# Patient Record
Sex: Male | Born: 1970 | Race: Black or African American | Hispanic: No | Marital: Married | Smoking: Former smoker
Health system: Southern US, Community
[De-identification: ages and names within clinical notes are randomized; demographics above are authoritative.]

## PROBLEM LIST (undated history)

## (undated) DIAGNOSIS — I10 Essential (primary) hypertension: Secondary | ICD-10-CM

## (undated) DIAGNOSIS — Z969 Presence of functional implant, unspecified: Secondary | ICD-10-CM

## (undated) HISTORY — PX: WRIST SURGERY: SHX841

## (undated) HISTORY — PX: APPENDECTOMY: SHX54

---

## 2015-02-18 ENCOUNTER — Encounter (HOSPITAL_COMMUNITY): Payer: Self-pay | Admitting: Emergency Medicine

## 2015-02-18 ENCOUNTER — Emergency Department (HOSPITAL_COMMUNITY): Payer: Self-pay

## 2015-02-18 ENCOUNTER — Emergency Department (HOSPITAL_COMMUNITY)
Admission: EM | Admit: 2015-02-18 | Discharge: 2015-02-18 | Disposition: A | Discharge status: Home / Self Care | Payer: Self-pay | Attending: Emergency Medicine | Admitting: Emergency Medicine

## 2015-02-18 DIAGNOSIS — Z8781 Personal history of (healed) traumatic fracture: Secondary | ICD-10-CM | POA: Insufficient documentation

## 2015-02-18 DIAGNOSIS — M25531 Pain in right wrist: Secondary | ICD-10-CM | POA: Insufficient documentation

## 2015-02-18 DIAGNOSIS — G8929 Other chronic pain: Secondary | ICD-10-CM | POA: Insufficient documentation

## 2015-02-18 DIAGNOSIS — R531 Weakness: Secondary | ICD-10-CM | POA: Insufficient documentation

## 2015-02-18 DIAGNOSIS — I10 Essential (primary) hypertension: Secondary | ICD-10-CM | POA: Insufficient documentation

## 2015-02-18 DIAGNOSIS — Z9889 Other specified postprocedural states: Secondary | ICD-10-CM | POA: Insufficient documentation

## 2015-02-18 HISTORY — DX: Essential (primary) hypertension: I10

## 2015-02-18 MED ORDER — HYDROCODONE-ACETAMINOPHEN 5-325 MG PO TABS
1.0000 | ORAL_TABLET | Freq: Once | ORAL | Status: AC
  Administered 2015-02-18: 1 via ORAL
  Filled 2015-02-18: qty 1

## 2015-02-18 MED ORDER — HYDROCODONE-ACETAMINOPHEN 5-325 MG PO TABS: 1.0000 | ORAL_TABLET | ORAL | Status: AC | PRN
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
## 2015-02-18 NOTE — Discharge Instructions (Signed)
Wrist Pain Wrist injuries are frequent in adults and children. A sprain is an injury to the ligaments that hold your bones together. A strain is an injury to muscle or muscle cord-like structures (tendons) from stretching or pulling. Generally, when wrists are moderately tender to touch following a fall or injury, a break in the bone (fracture) may be present. Most wrist sprains or strains are better in 3 to 5 days, but complete healing may take several weeks. HOME CARE INSTRUCTIONS   Put ice on the injured area.  Put ice in a plastic bag.  Place a towel between your skin and the bag.  Leave the ice on for 15-20 minutes, 3-4 times a day, for the first 2 days, or as directed by your health care provider.  Keep your arm raised above the level of your heart whenever possible to reduce swelling and pain.  Rest the injured area for at least 48 hours or as directed by your health care provider.  If a splint or elastic bandage has been applied, use it for as long as directed by your health care provider or until seen by a health care provider for a follow-up exam.  Only take over-the-counter or prescription medicines for pain, discomfort, or fever as directed by your health care provider.  Keep all follow-up appointments. You may need to follow up with a specialist or have follow-up X-rays. Improvement in pain level is not a guarantee that you did not fracture a bone in your wrist. The only way to determine whether or not you have a broken bone is by X-ray. SEEK IMMEDIATE MEDICAL CARE IF:   Your fingers are swollen, very red, white, or cold and blue.  Your fingers are numb or tingling.  You have increasing pain.  You have difficulty moving your fingers. MAKE SURE YOU:   Understand these instructions.  Will watch your condition.  Will get help right away if you are not doing well or get worse. Document Released: 03/09/2005 Document Revised: 06/04/2013 Document Reviewed:  07/21/2010 [facility] Patient Information 2015 [place], [place]. This information is not intended to replace advice given to you by your health care provider. Make sure you discuss any questions you have with your health care provider.   You may take the hydrocodone prescribed for pain relief.  This will make you drowsy - do not drive within 4 hours of taking this medication.

## 2015-02-18 NOTE — ED Notes (Signed)
Patient reports has had surgery to right wrist and has broken right hand about a year ago. Reports is having increased pain to right wrist and right hand. Denies recent injury.

## 2015-03-10 ENCOUNTER — Other Ambulatory Visit: Payer: Self-pay | Admitting: Orthopedic Surgery

## 2015-03-11 ENCOUNTER — Encounter (HOSPITAL_BASED_OUTPATIENT_CLINIC_OR_DEPARTMENT_OTHER): Payer: Self-pay | Admitting: *Deleted

## 2015-03-16 ENCOUNTER — Encounter (HOSPITAL_BASED_OUTPATIENT_CLINIC_OR_DEPARTMENT_OTHER): Payer: Self-pay | Admitting: *Deleted

## 2015-03-16 ENCOUNTER — Ambulatory Visit (HOSPITAL_BASED_OUTPATIENT_CLINIC_OR_DEPARTMENT_OTHER): Payer: Self-pay | Admitting: Anesthesiology

## 2015-03-16 ENCOUNTER — Encounter (HOSPITAL_BASED_OUTPATIENT_CLINIC_OR_DEPARTMENT_OTHER)
Admission: RE | Disposition: A | Discharge status: Home / Self Care | Payer: Self-pay | Source: Ambulatory Visit | Attending: Orthopedic Surgery

## 2015-03-16 ENCOUNTER — Ambulatory Visit (HOSPITAL_BASED_OUTPATIENT_CLINIC_OR_DEPARTMENT_OTHER)
Admission: RE | Admit: 2015-03-16 | Discharge: 2015-03-16 | Disposition: A | Discharge status: Home / Self Care | Payer: Self-pay | Source: Ambulatory Visit | Attending: Orthopedic Surgery | Admitting: Orthopedic Surgery

## 2015-03-16 DIAGNOSIS — Z87891 Personal history of nicotine dependence: Secondary | ICD-10-CM | POA: Insufficient documentation

## 2015-03-16 DIAGNOSIS — M659 Synovitis and tenosynovitis, unspecified: Secondary | ICD-10-CM | POA: Insufficient documentation

## 2015-03-16 DIAGNOSIS — T8484XA Pain due to internal orthopedic prosthetic devices, implants and grafts, initial encounter: Secondary | ICD-10-CM | POA: Insufficient documentation

## 2015-03-16 DIAGNOSIS — Y831 Surgical operation with implant of artificial internal device as the cause of abnormal reaction of the patient, or of later complication, without mention of misadventure at the time of the procedure: Secondary | ICD-10-CM | POA: Insufficient documentation

## 2015-03-16 DIAGNOSIS — I10 Essential (primary) hypertension: Secondary | ICD-10-CM | POA: Insufficient documentation

## 2015-03-16 HISTORY — DX: Presence of functional implant, unspecified: Z96.9

## 2015-03-16 HISTORY — PX: HARDWARE REMOVAL: SHX979

## 2015-03-16 HISTORY — PX: TENOLYSIS: SHX396

## 2015-03-16 SURGERY — REMOVAL, HARDWARE
Anesthesia: General | Site: Wrist | Laterality: Right

## 2015-03-16 MED ORDER — OXYCODONE HCL 5 MG PO TABS
ORAL_TABLET | ORAL | Status: AC
  Filled 2015-03-16: qty 1

## 2015-03-16 MED ORDER — MIDAZOLAM HCL 2 MG/2ML IJ SOLN
INTRAMUSCULAR | Status: AC
  Filled 2015-03-16: qty 4

## 2015-03-16 MED ORDER — HYDROMORPHONE HCL 1 MG/ML IJ SOLN
INTRAMUSCULAR | Status: AC
  Filled 2015-03-16: qty 1

## 2015-03-16 MED ORDER — HYDROMORPHONE HCL 1 MG/ML IJ SOLN
0.2500 mg | INTRAMUSCULAR | Status: DC | PRN
  Administered 2015-03-16 (×3): 0.5 mg via INTRAVENOUS

## 2015-03-16 MED ORDER — DEXAMETHASONE SODIUM PHOSPHATE 10 MG/ML IJ SOLN
INTRAMUSCULAR | Status: AC
  Filled 2015-03-16: qty 1

## 2015-03-16 MED ORDER — LIDOCAINE HCL (CARDIAC) 20 MG/ML IV SOLN
INTRAVENOUS | Status: DC | PRN
  Administered 2015-03-16: 100 mg via INTRAVENOUS

## 2015-03-16 MED ORDER — CHLORHEXIDINE GLUCONATE 4 % EX LIQD: 60.0000 mL | Freq: Once | CUTANEOUS | Status: DC

## 2015-03-16 MED ORDER — OXYCODONE-ACETAMINOPHEN 5-325 MG PO TABS: 1.0000 | ORAL_TABLET | ORAL | Status: AC | PRN

## 2015-03-16 MED ORDER — GLYCOPYRROLATE 0.2 MG/ML IJ SOLN
INTRAMUSCULAR | Status: AC
  Filled 2015-03-16: qty 1

## 2015-03-16 MED ORDER — OXYCODONE HCL 5 MG PO TABS
5.0000 mg | ORAL_TABLET | Freq: Once | ORAL | Status: AC
Start: 2015-03-16 — End: 2015-03-16
  Administered 2015-03-16: 5 mg via ORAL

## 2015-03-16 MED ORDER — SUCCINYLCHOLINE CHLORIDE 20 MG/ML IJ SOLN
INTRAMUSCULAR | Status: AC
  Filled 2015-03-16: qty 1

## 2015-03-16 MED ORDER — BUPIVACAINE HCL (PF) 0.25 % IJ SOLN
INTRAMUSCULAR | Status: DC | PRN
Start: 2015-03-16 — End: 2015-03-16
  Administered 2015-03-16: 10 mL

## 2015-03-16 MED ORDER — MIDAZOLAM HCL 2 MG/2ML IJ SOLN
1.0000 mg | INTRAMUSCULAR | Status: DC | PRN
  Administered 2015-03-16: 2 mg via INTRAVENOUS

## 2015-03-16 MED ORDER — LIDOCAINE HCL (CARDIAC) 20 MG/ML IV SOLN
INTRAVENOUS | Status: AC
  Filled 2015-03-16: qty 5

## 2015-03-16 MED ORDER — PROMETHAZINE HCL 25 MG/ML IJ SOLN: 6.2500 mg | INTRAMUSCULAR | Status: DC | PRN

## 2015-03-16 MED ORDER — FENTANYL CITRATE (PF) 100 MCG/2ML IJ SOLN
INTRAMUSCULAR | Status: AC
  Filled 2015-03-16: qty 4

## 2015-03-16 MED ORDER — PROPOFOL 10 MG/ML IV BOLUS
INTRAVENOUS | Status: AC
  Filled 2015-03-16: qty 20

## 2015-03-16 MED ORDER — LACTATED RINGERS IV SOLN
INTRAVENOUS | Status: DC
  Administered 2015-03-16: 11:00:00 via INTRAVENOUS

## 2015-03-16 MED ORDER — ONDANSETRON HCL 4 MG/2ML IJ SOLN
INTRAMUSCULAR | Status: DC | PRN
  Administered 2015-03-16: 4 mg via INTRAVENOUS

## 2015-03-16 MED ORDER — ONDANSETRON HCL 4 MG/2ML IJ SOLN
INTRAMUSCULAR | Status: AC
Start: 2015-03-16 — End: 2015-03-16
  Filled 2015-03-16: qty 2

## 2015-03-16 MED ORDER — BUPIVACAINE HCL (PF) 0.25 % IJ SOLN
INTRAMUSCULAR | Status: AC
  Filled 2015-03-16: qty 30

## 2015-03-16 MED ORDER — CEFAZOLIN SODIUM-DEXTROSE 2-3 GM-% IV SOLR
INTRAVENOUS | Status: AC
  Filled 2015-03-16: qty 50

## 2015-03-16 MED ORDER — MEPERIDINE HCL 25 MG/ML IJ SOLN: 6.2500 mg | INTRAMUSCULAR | Status: DC | PRN

## 2015-03-16 MED ORDER — PROPOFOL 10 MG/ML IV BOLUS
INTRAVENOUS | Status: DC | PRN
  Administered 2015-03-16: 200 mg via INTRAVENOUS

## 2015-03-16 MED ORDER — CEFAZOLIN SODIUM-DEXTROSE 2-3 GM-% IV SOLR
2.0000 g | INTRAVENOUS | Status: AC
  Administered 2015-03-16: 2 g via INTRAVENOUS

## 2015-03-16 MED ORDER — GLYCOPYRROLATE 0.2 MG/ML IJ SOLN
0.2000 mg | Freq: Once | INTRAMUSCULAR | Status: AC | PRN
  Administered 2015-03-16: 0.2 mg via INTRAVENOUS

## 2015-03-16 MED ORDER — FENTANYL CITRATE (PF) 100 MCG/2ML IJ SOLN
50.0000 ug | INTRAMUSCULAR | Status: AC | PRN
  Administered 2015-03-16: 100 ug via INTRAVENOUS
  Administered 2015-03-16 (×2): 50 ug via INTRAVENOUS

## 2015-03-16 MED ORDER — DEXAMETHASONE SODIUM PHOSPHATE 4 MG/ML IJ SOLN
INTRAMUSCULAR | Status: DC | PRN
  Administered 2015-03-16: 10 mg via INTRAVENOUS

## 2015-03-16 MED ORDER — LACTATED RINGERS IV SOLN: INTRAVENOUS | Status: DC

## 2015-03-16 SURGICAL SUPPLY — 71 items
APL SKNCLS STERI-STRIP NONHPOA (GAUZE/BANDAGES/DRESSINGS) ×1
BANDAGE ELASTIC 3 VELCRO ST LF (GAUZE/BANDAGES/DRESSINGS) ×3 IMPLANT
BANDAGE ELASTIC 4 VELCRO ST LF (GAUZE/BANDAGES/DRESSINGS) ×3 IMPLANT
BENZOIN TINCTURE PRP APPL 2/3 (GAUZE/BANDAGES/DRESSINGS) ×2 IMPLANT
BLADE MINI RND TIP GREEN BEAV (BLADE) IMPLANT
BLADE SURG 15 STRL LF DISP TIS (BLADE) ×1 IMPLANT
BLADE SURG 15 STRL SS (BLADE) ×3
BNDG CMPR 9X4 STRL LF SNTH (GAUZE/BANDAGES/DRESSINGS) ×1
BNDG CMPR MD 5X2 ELC HKLP STRL (GAUZE/BANDAGES/DRESSINGS)
BNDG ELASTIC 2 VLCR STRL LF (GAUZE/BANDAGES/DRESSINGS) IMPLANT
BNDG ESMARK 4X9 LF (GAUZE/BANDAGES/DRESSINGS) ×2 IMPLANT
BNDG GAUZE ELAST 4 BULKY (GAUZE/BANDAGES/DRESSINGS) ×3 IMPLANT
BRUSH SCRUB EZ PLAIN DRY (MISCELLANEOUS) IMPLANT
CLOSURE WOUND 1/2 X4 (GAUZE/BANDAGES/DRESSINGS) ×1
CORDS BIPOLAR (ELECTRODE) ×3 IMPLANT
COVER BACK TABLE 60X90IN (DRAPES) ×3 IMPLANT
CUFF TOURNIQUET SINGLE 18IN (TOURNIQUET CUFF) ×2 IMPLANT
DECANTER SPIKE VIAL GLASS SM (MISCELLANEOUS) ×2 IMPLANT
DRAPE EXTREMITY T 121X128X90 (DRAPE) ×3 IMPLANT
DRAPE OEC MINIVIEW 54X84 (DRAPES) IMPLANT
DRAPE SURG 17X23 STRL (DRAPES) ×3 IMPLANT
DURAPREP 26ML APPLICATOR (WOUND CARE) ×3 IMPLANT
GAUZE SPONGE 4X4 12PLY STRL (GAUZE/BANDAGES/DRESSINGS) ×3 IMPLANT
GAUZE SPONGE 4X4 16PLY XRAY LF (GAUZE/BANDAGES/DRESSINGS) IMPLANT
GAUZE XEROFORM 1X8 LF (GAUZE/BANDAGES/DRESSINGS) IMPLANT
GLOVE SURG SS PI 7.0 STRL IVOR (GLOVE) ×2 IMPLANT
GLOVE SURG SYN 8.0 (GLOVE) ×6 IMPLANT
GLOVE SURG SYN 8.0 PF PI (GLOVE) ×2 IMPLANT
GOWN STRL REUS W/ TWL LRG LVL3 (GOWN DISPOSABLE) ×1 IMPLANT
GOWN STRL REUS W/TWL LRG LVL3 (GOWN DISPOSABLE) ×3
GOWN STRL REUS W/TWL XL LVL3 (GOWN DISPOSABLE) ×6 IMPLANT
NDL HYPO 25X1 1.5 SAFETY (NEEDLE) IMPLANT
NEEDLE HYPO 25X1 1.5 SAFETY (NEEDLE) ×3 IMPLANT
NS IRRIG 1000ML POUR BTL (IV SOLUTION) ×3 IMPLANT
PACK BASIN DAY SURGERY FS (CUSTOM PROCEDURE TRAY) ×3 IMPLANT
PAD CAST 4YDX4 CTTN HI CHSV (CAST SUPPLIES) ×1 IMPLANT
PADDING CAST ABS 4INX4YD NS (CAST SUPPLIES) ×2
PADDING CAST ABS COTTON 4X4 ST (CAST SUPPLIES) ×1 IMPLANT
PADDING CAST COTTON 4X4 STRL (CAST SUPPLIES) ×3
PADDING UNDERCAST 2 STRL (CAST SUPPLIES)
PADDING UNDERCAST 2X4 STRL (CAST SUPPLIES) IMPLANT
SHEET MEDIUM DRAPE 40X70 STRL (DRAPES) ×3 IMPLANT
SLEEVE SCD COMPRESS KNEE MED (MISCELLANEOUS) ×2 IMPLANT
SPLINT PLASTER CAST XFAST 4X15 (CAST SUPPLIES) IMPLANT
SPLINT PLASTER XTRA FAST SET 4 (CAST SUPPLIES) ×30
STOCKINETTE 4X48 STRL (DRAPES) ×3 IMPLANT
STRIP CLOSURE SKIN 1/2X4 (GAUZE/BANDAGES/DRESSINGS) ×1 IMPLANT
SUT ETHIBOND 3-0 V-5 (SUTURE) IMPLANT
SUT ETHILON 3 0 PS 1 (SUTURE) IMPLANT
SUT ETHILON 4 0 PS 2 18 (SUTURE) IMPLANT
SUT ETHILON 5 0 PS 2 18 (SUTURE) IMPLANT
SUT MON AB 4-0 PC3 18 (SUTURE) IMPLANT
SUT PROLENE 3 0 PS 2 (SUTURE) ×2 IMPLANT
SUT PROLENE 6 0 PC 1 (SUTURE) IMPLANT
SUT SILK 4 0 PS 2 (SUTURE) IMPLANT
SUT VIC AB 0 CT3 27 (SUTURE) IMPLANT
SUT VIC AB 0 SH 27 (SUTURE) IMPLANT
SUT VIC AB 2-0 SH 27 (SUTURE) ×3
SUT VIC AB 2-0 SH 27XBRD (SUTURE) IMPLANT
SUT VIC AB 3-0 PS1 18 (SUTURE)
SUT VIC AB 3-0 PS1 18XBRD (SUTURE) IMPLANT
SUT VIC AB 4-0 P-3 18XBRD (SUTURE) IMPLANT
SUT VIC AB 4-0 P3 18 (SUTURE)
SUT VICRYL 4-0 PS2 18IN ABS (SUTURE) IMPLANT
SUT VICRYL RAPIDE 4-0 (SUTURE) IMPLANT
SUT VICRYL RAPIDE 4/0 PS 2 (SUTURE) IMPLANT
SYR BULB 3OZ (MISCELLANEOUS) ×3 IMPLANT
SYRINGE 10CC LL (SYRINGE) ×2 IMPLANT
TOWEL OR 17X24 6PK STRL BLUE (TOWEL DISPOSABLE) ×3 IMPLANT
TRAY DSU PREP LF (CUSTOM PROCEDURE TRAY) IMPLANT
UNDERPAD 30X30 (UNDERPADS AND DIAPERS) ×3 IMPLANT

## 2015-03-16 NOTE — H&P (Signed)
Brad Cooper is an 44 y.o. male.   Chief Complaint: right wrist pain anddorsal swelling HPI: as above s/p dorsal ORIF for wrist fracture  Past Medical History  Diagnosis Date  . Hypertension     no meds  . Retained orthopedic hardware     right wrist    Past Surgical History  Procedure Laterality Date  . Wrist surgery Right   . Appendectomy      History reviewed. No pertinent family history. Social History:  reports that he quit smoking about 3 months ago. He does not have any smokeless tobacco history on file. He reports that he does not drink alcohol or use illicit drugs.  Allergies: No Known Allergies  No prescriptions prior to admission    No results found for this or any previous visit (from the past 48 hour(s)). No results found.  Review of Systems  All other systems reviewed and are negative.   Height  (1.753 m), weight 112.038 kg (247 lb). Physical Exam  Constitutional: He is oriented to person, place, and time. He appears well-developed and well-nourished.  HENT:  Head: Normocephalic and atraumatic.  Cardiovascular: Normal rate.   Respiratory: Effort normal.  Musculoskeletal:       Right wrist: He exhibits tenderness and swelling.  Right wrist dorsal pain and swelling s/p ORIF  Neurological: He is alert and oriented to person, place, and time.  Skin: Skin is warm.  Psychiatric: He has a normal mood and affect. His behavior is normal. Judgment and thought content normal.     Assessment/Plan As above  Plan hardware removal and extensor tenolysis  Sherah Lund A 03/16/2015, 9:40 AM

## 2015-03-16 NOTE — Discharge Instructions (Signed)

## 2015-03-16 NOTE — Anesthesia Postprocedure Evaluation (Signed)
  Anesthesia Post-op Note  Patient: Brad Cooper  Procedure(s) Performed: Procedure(s): RIGHT WRIST PLATE REMOVAL (Right) EXTENSOR TENOLYSIS (Right)  Patient Location: PACU  Anesthesia Type:General  Level of Consciousness: awake, alert  and oriented  Airway and Oxygen Therapy: Patient Spontanous Breathing  Post-op Pain: none  Post-op Assessment: Post-op Vital signs reviewed and Patient's Cardiovascular Status Stable              Post-op Vital Signs: Reviewed and stable  Last Vitals:  Filed Vitals:   03/16/15 1440  BP: 152/84  Pulse: 48  Temp: 36.8 C  Resp: 16    Complications: No apparent anesthesia complications

## 2015-03-16 NOTE — Anesthesia Preprocedure Evaluation (Addendum)
Anesthesia Evaluation  Patient identified by MRN, date of birth, ID band Patient awake    Reviewed: Allergy & Precautions, NPO status , Patient's Chart, lab work & pertinent test results  Airway Mallampati: II  TM Distance: >3 FB Neck ROM: Full    Dental  (+) Teeth Intact   Pulmonary former smoker,    breath sounds clear to auscultation       Cardiovascular hypertension,  Rhythm:Regular Rate:Normal     Neuro/Psych negative neurological ROS  negative psych ROS   GI/Hepatic negative GI ROS, Neg liver ROS,   Endo/Other  negative endocrine ROS  Renal/GU negative Renal ROS  negative genitourinary   Musculoskeletal negative musculoskeletal ROS (+)   Abdominal   Peds negative pediatric ROS (+)  Hematology negative hematology ROS (+)   Anesthesia Other Findings   Reproductive/Obstetrics negative OB ROS                            No results found for: WBC, HGB, HCT, MCV, PLT  No results found for: CREATININE, BUN, NA, K, CL, CO2   Anesthesia Physical Anesthesia Plan  ASA: II  Anesthesia Plan: General   Post-op Pain Management:    Induction: Intravenous  Airway Management Planned: LMA  Additional Equipment:   Intra-op Plan:   Post-operative Plan: Extubation in OR  Informed Consent: I have reviewed the patients History and Physical, chart, labs and discussed the procedure including the risks, benefits and alternatives for the proposed anesthesia with the patient or authorized representative who has indicated his/her understanding and acceptance.   Dental advisory given  Plan Discussed with: CRNA  Anesthesia Plan Comments:         Anesthesia Quick Evaluation

## 2015-03-16 NOTE — Transfer of Care (Signed)
Immediate Anesthesia Transfer of Care Note  Patient: Brad Cooper  Procedure(s) Performed: Procedure(s): RIGHT WRIST PLATE REMOVAL (Right) EXTENSOR TENOLYSIS (Right)  Patient Location: PACU  Anesthesia Type:General  Level of Consciousness: awake and sedated  Airway & Oxygen Therapy: Patient Spontanous Breathing and Patient connected to face mask oxygen  Post-op Assessment: Report given to RN and Post -op Vital signs reviewed and stable  Post vital signs: Reviewed and stable  Last Vitals:  Filed Vitals:   03/16/15 1310  BP:   Pulse: 67  Temp:   Resp: 19    Complications: No apparent anesthesia complications

## 2015-03-16 NOTE — Anesthesia Procedure Notes (Signed)
Procedure Name: LMA Insertion Date/Time: 03/16/2015 12:09 PM Performed by: Gar Gibbon Pre-anesthesia Checklist: Patient identified, Emergency Drugs available, Suction available and Patient being monitored Patient Re-evaluated:Patient Re-evaluated prior to inductionOxygen Delivery Method: Circle System Utilized Preoxygenation: Pre-oxygenation with 100% oxygen Intubation Type: IV induction Ventilation: Mask ventilation without difficulty LMA: LMA inserted LMA Size: 4.0 Number of attempts: 1 Airway Equipment and Method: Bite block Placement Confirmation: positive ETCO2 Tube secured with: Tape Dental Injury: Teeth and Oropharynx as per pre-operative assessment

## 2015-03-16 NOTE — Op Note (Signed)
See note [id number]

## 2015-03-17 ENCOUNTER — Encounter (HOSPITAL_BASED_OUTPATIENT_CLINIC_OR_DEPARTMENT_OTHER): Payer: Self-pay | Admitting: Orthopedic Surgery

## 2015-03-17 NOTE — Op Note (Signed)
NAMEDEMARION, Brad Cooper               ACCOUNT NO.:  [id number]  MEDICAL RECORD NO.:  [id number]  LOCATION:                               FACILITY:  [facility]  PHYSICIAN:  Artist Pais. Rivaldo Hineman, M.D.DATE OF BIRTH:  10-21-1970  DATE OF PROCEDURE:  03/16/2015 DATE OF DISCHARGE:  03/16/2015                              OPERATIVE REPORT   PREOPERATIVE DIAGNOSIS:  Right wrist retained painful dorsal hardware with extensor synovitis and loss of wrist motion.  POSTOPERATIVE DIAGNOSIS:  Right wrist retained painful dorsal hardware with extensor synovitis and loss of wrist motion.  PROCEDURE:  Hardware removal, left distal radius dorsally with tenolysis of extensor carpi radialis longus and brevis tendon and a gentle manipulation of wrist joint.  SURGEON:  Artist Pais. Mina Marble, M.D.  ASSISTANT:  None.  ANESTHESIA:  General.  COMPLICATIONS:  No complications.  DRAINS:  No drains.  SPECIMENS:  No specimens were sent.  DESCRIPTION OF PROCEDURE:  The patient was taken to the operating suite. After induction of adequate general anesthetic, right upper extremity was prepped and draped in sterile fashion.  An Esmarch was used to exsanguinate the limb.  Tourniquet was inflated to 250 mmHg.  At this point in time, an incision was made over the dorsoradial aspect of the right wrist using his old incision.  Skin was incised sharply. Dissection was carried down to the interval between the 4th and 2nd dorsal compartments.  We identified the EPL.  We stayed clear of the EPL and incised between ECRB and ECRL and EDC.  Dissection was carried down to the dorsal aspect of the distal radius.  There was a Synthes T-plate. It was removed using the Synthes small fragment hardware.  The radial most screw stripped.  We therefore had to use wire cutters to break the plate and eventually get the screw out with the broken plate.  All hardware was removed with no undue difficultly.  We thoroughly irrigated the wound.   We then gently manipulated the wrist joint and then carefully did a tenolysis of the extensor carpi radialis brevis and longus tendons.  The wound was irrigated and closed in layers with 2-0 undyed Vicryl.  A 3-0 Prolene subcuticular stitch on the skin.  Steri-Strips, 4x4s, fluffs, and a radial gutter splint was applied.  The patient tolerated the procedure and went to the recovery room in a stable fashion.     Artist Pais Mina Marble, M.D.     MAW/MEDQ  D:  03/16/2015  T:  03/16/2015  Job:  [id number]

## 2016-11-26 IMAGING — DX DG ELBOW COMPLETE 3+V*R*
4 series · 4 of 4 positions shown · non-contrast
Comparison: None.

CLINICAL DATA: Chronic right elbow pain for 1 year

EXAM:
RIGHT ELBOW - COMPLETE 3+ VIEW

[elbow ap]
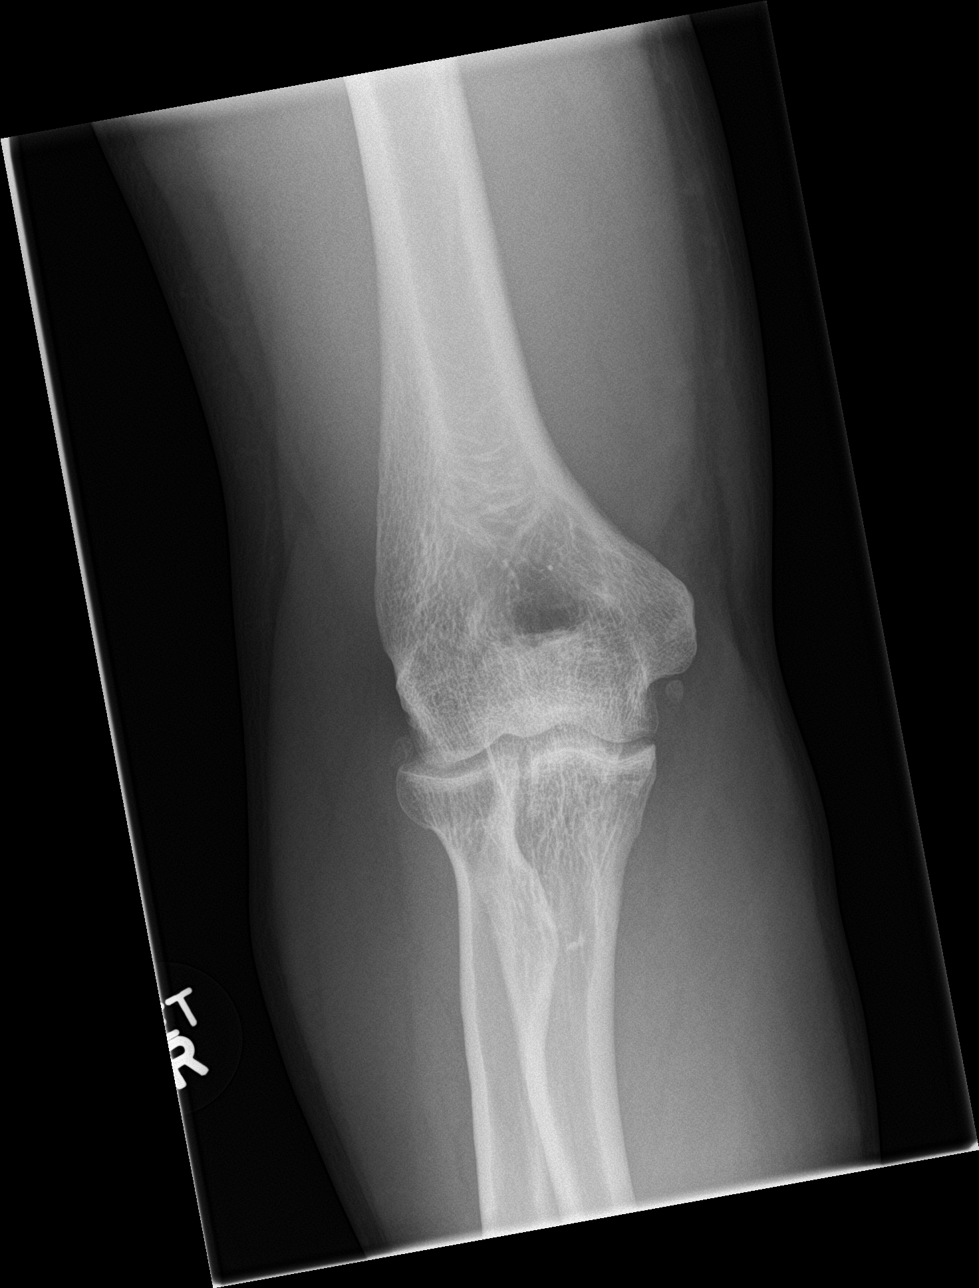

[elbow obl (1 of 2)]
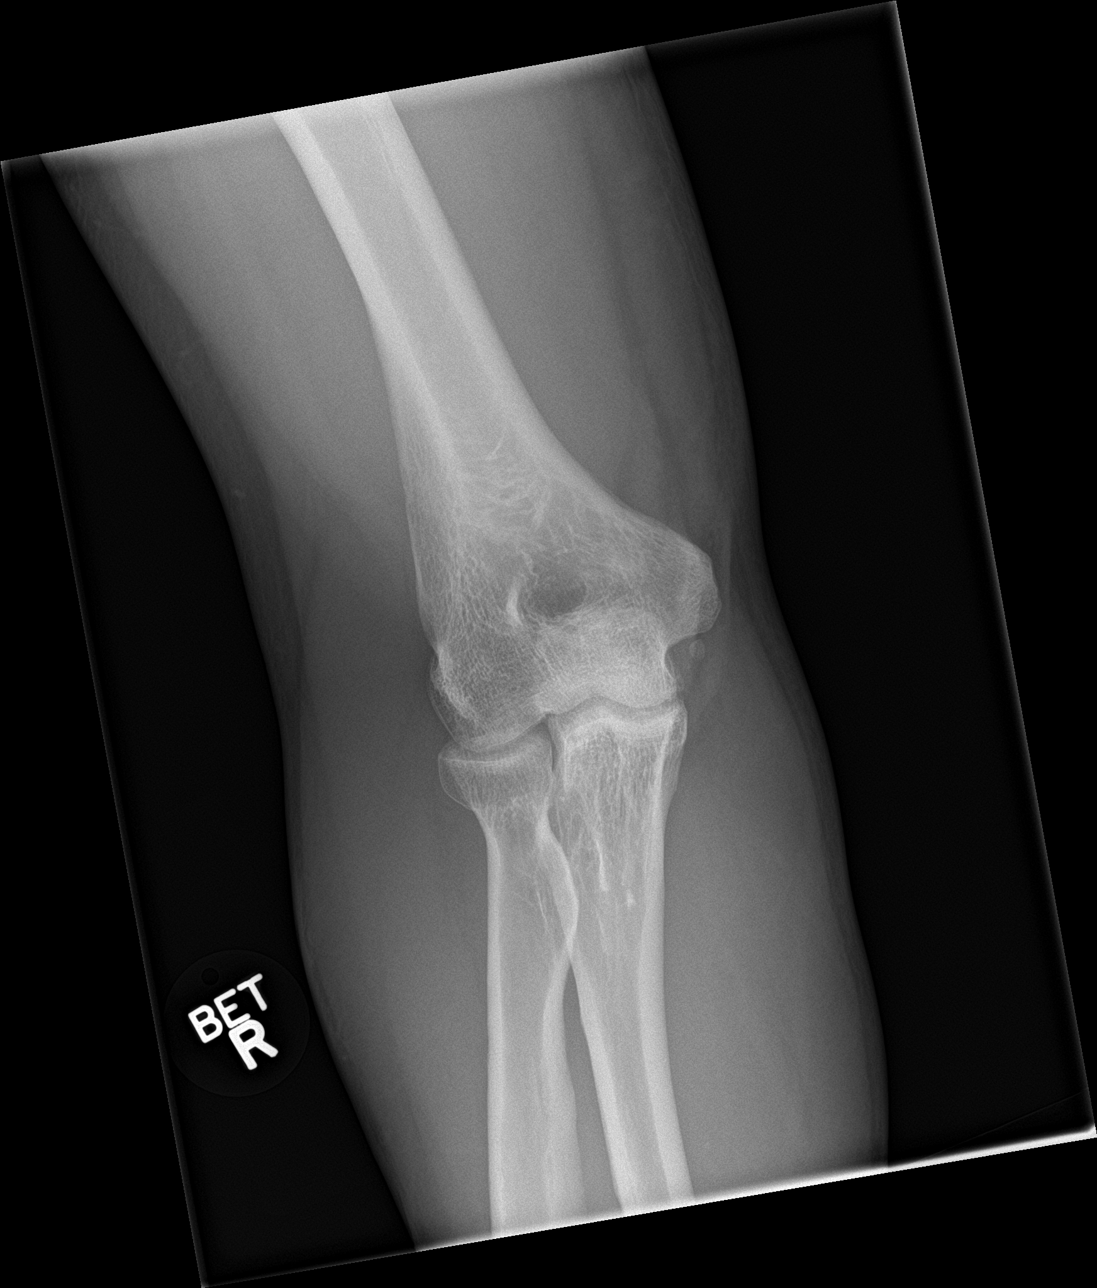

[elbow lat]
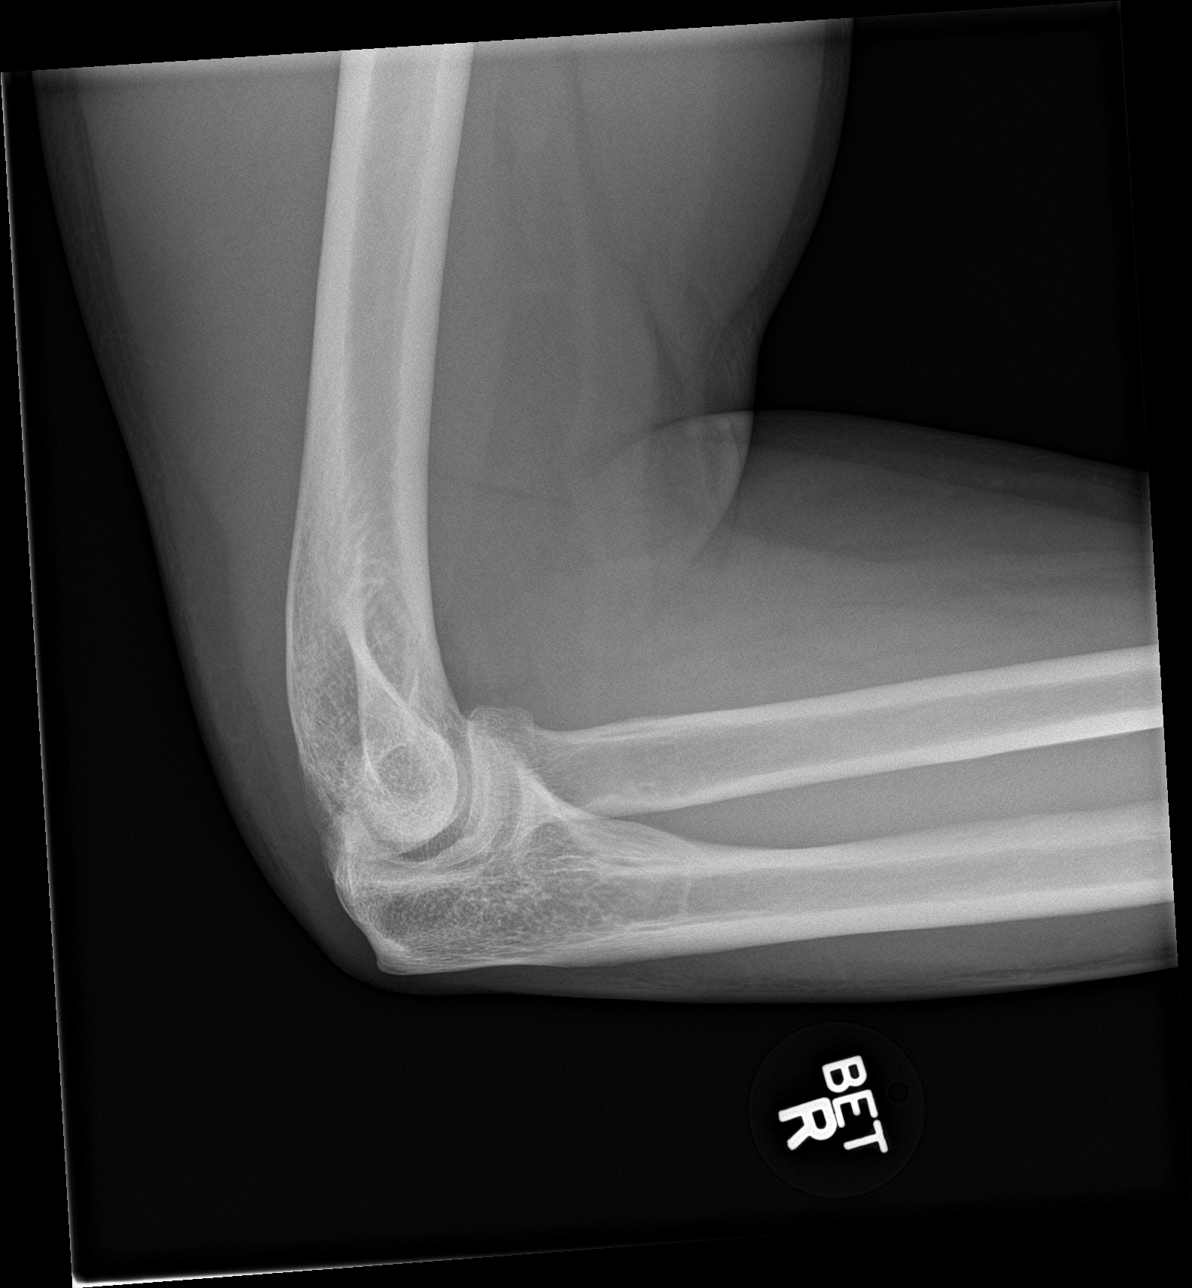

[elbow obl (2 of 2)]
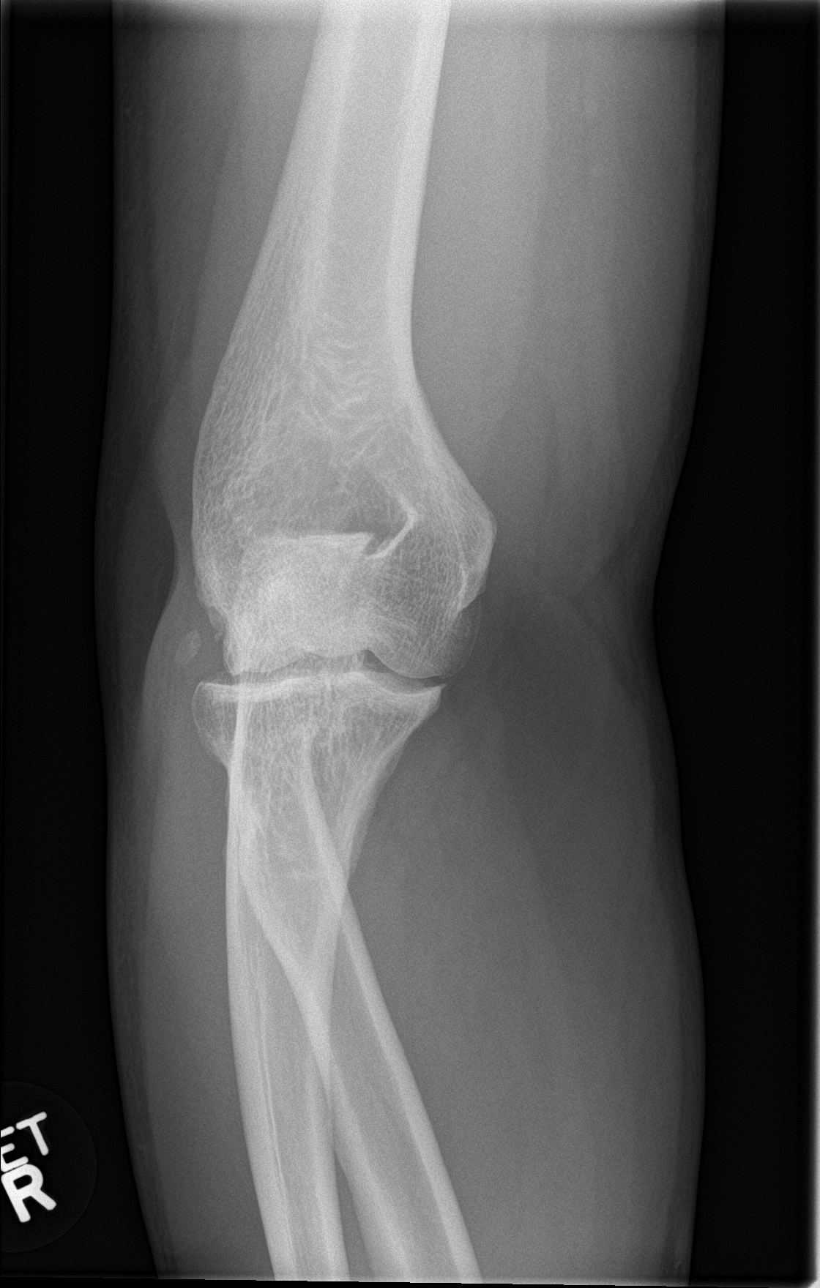

[4 of 4 positions shown; findings below may reference images not displayed]

FINDINGS: Loose bodies over the medial and lateral aspects of the joint. No
fracture or dislocation. No periosteal reaction. No joint effusion.
IMPRESSION: Degenerative changes with no acute findings

## 2016-11-26 IMAGING — DX DG WRIST COMPLETE 3+V*R*
4 series · 4 of 4 positions shown · non-contrast
Comparison: None.

CLINICAL DATA: Chronic right wrist pain 1 year worse the past few
days no injury

EXAM:
RIGHT WRIST - COMPLETE 3+ VIEW

[wrist pa (1 of 2)]
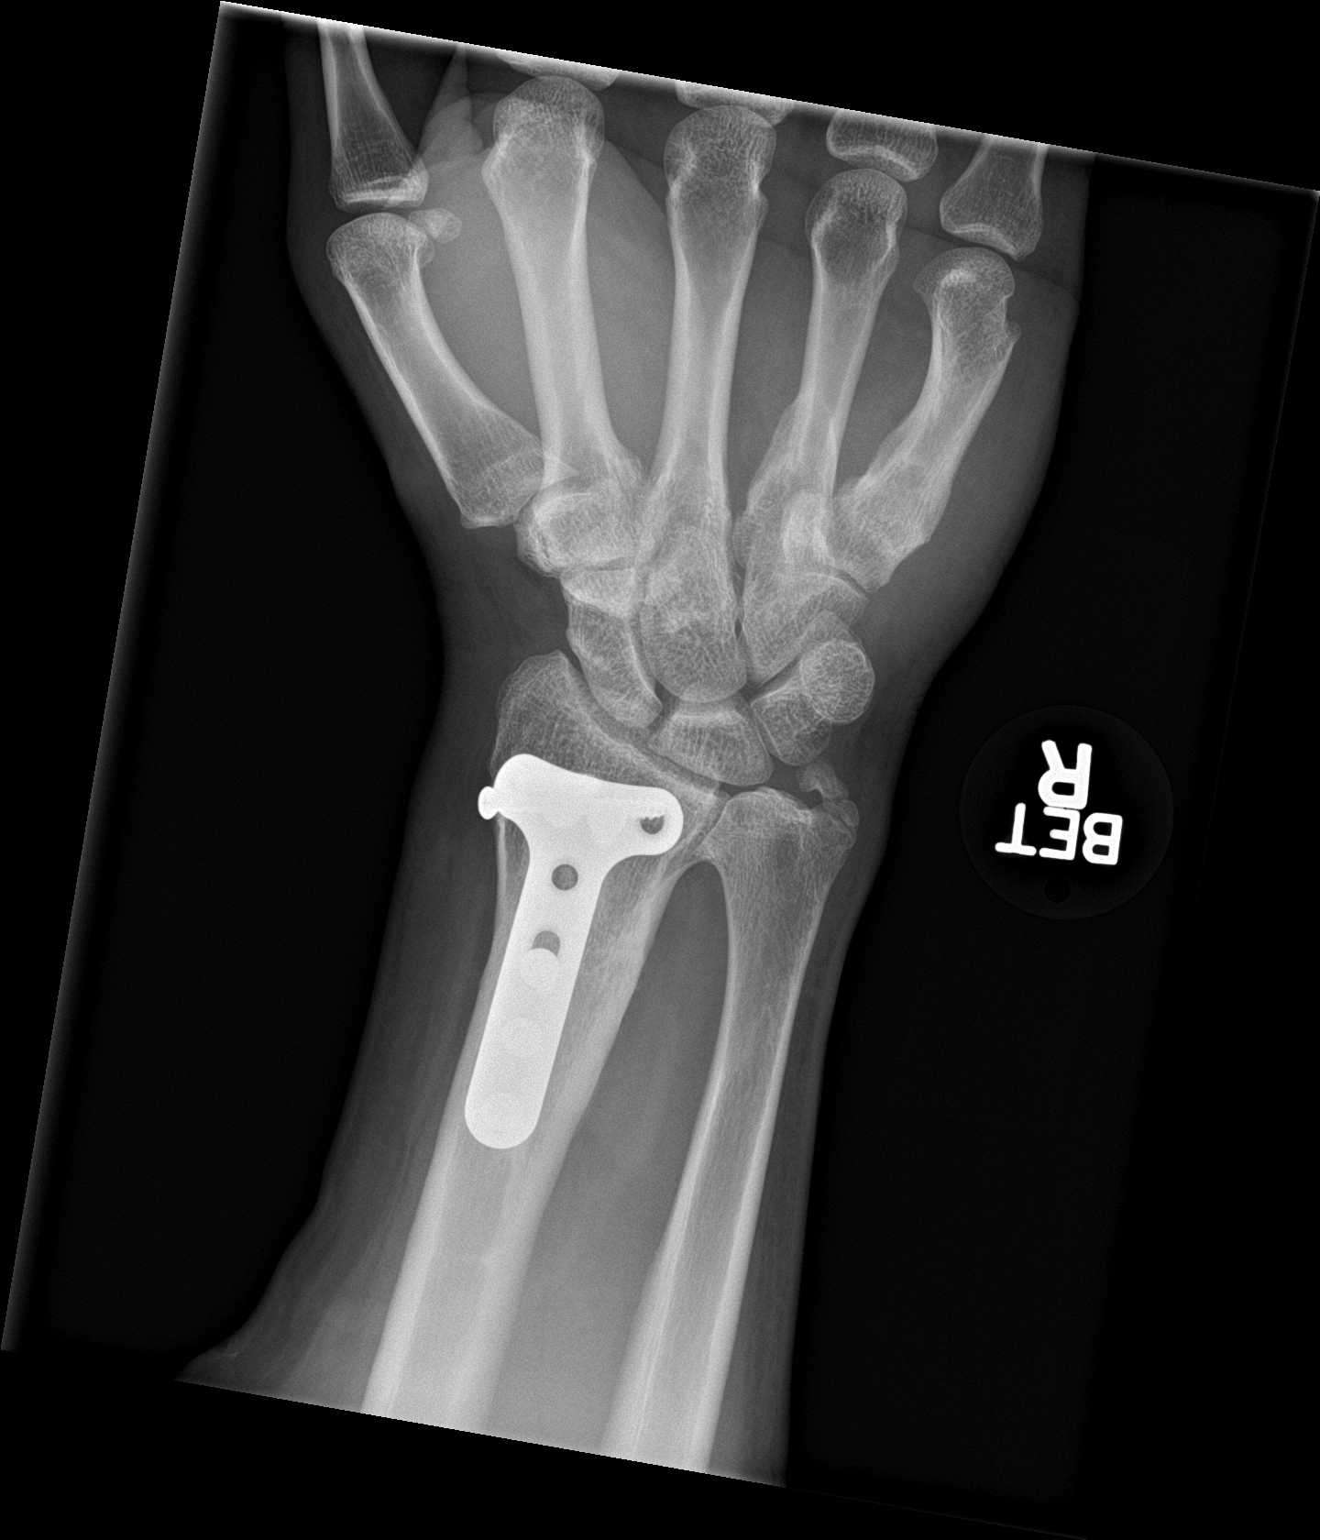

[wrist obl]
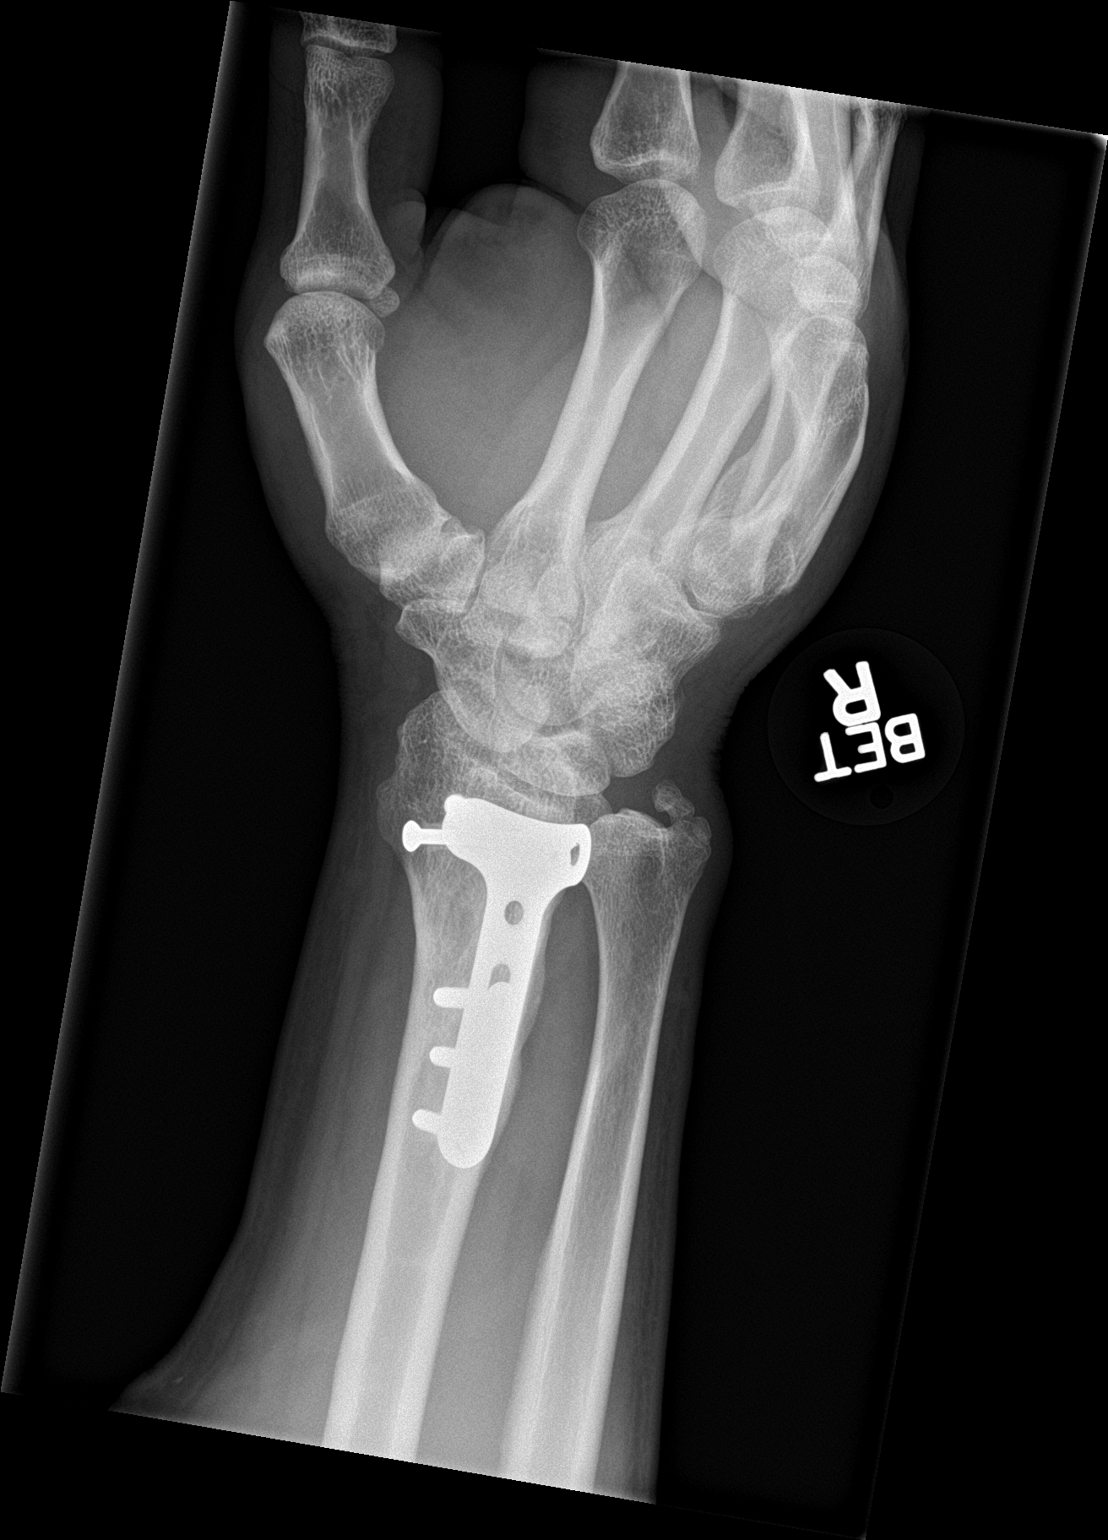

[wrist lat]
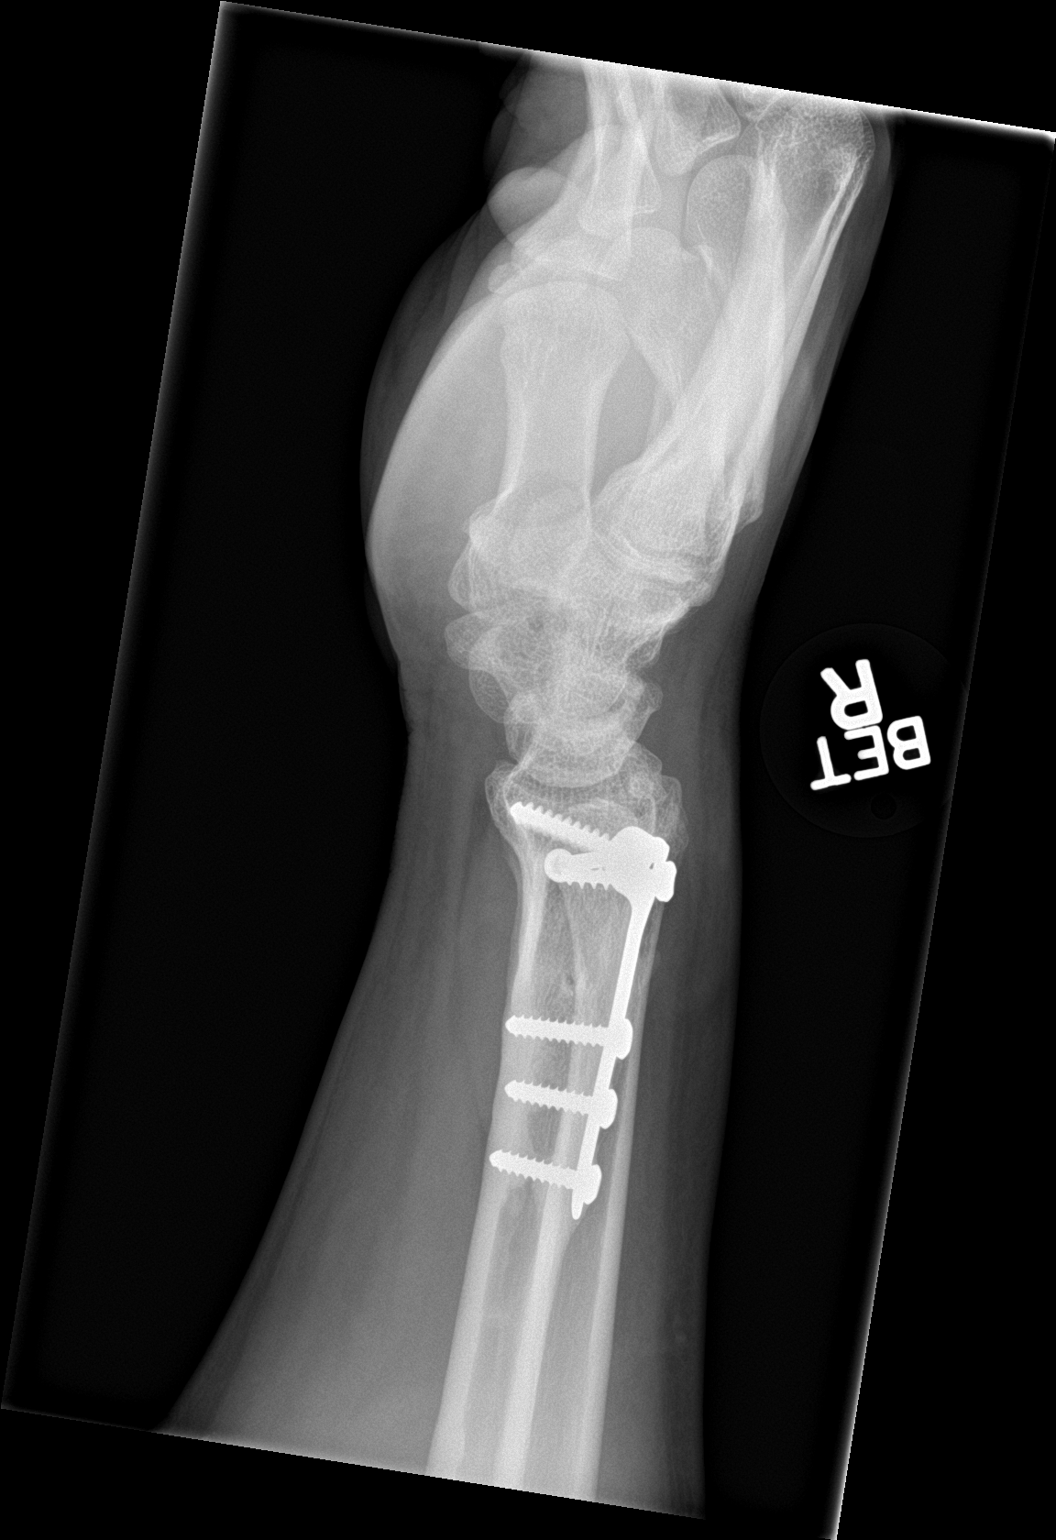

[wrist pa (2 of 2)]
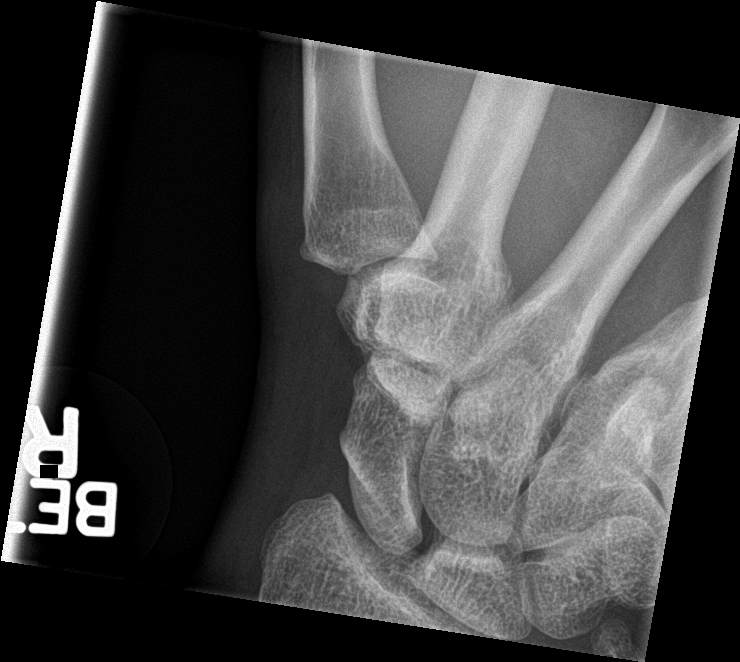

[4 of 4 positions shown; findings below may reference images not displayed]

FINDINGS: Old ulnar styloid fracture. Compression plate distal radial shaft
and metaphysis. No acute fracture or dislocation.
IMPRESSION: No acute findings
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
## 2024-07-15 ENCOUNTER — Encounter (HOSPITAL_COMMUNITY): Payer: Self-pay

## 2024-07-15 ENCOUNTER — Inpatient Hospital Stay: Admit: 2024-07-15 | Payer: Self-pay | Admitting: Internal Medicine
